# Patient Record
Sex: Male | Born: 1965 | Race: White | Hispanic: No | Marital: Married | Smoking: Former smoker
Health system: Southern US, Community
[De-identification: ages and names within clinical notes are randomized; demographics above are authoritative.]

---

## 2014-09-19 ENCOUNTER — Ambulatory Visit (INDEPENDENT_AMBULATORY_CARE_PROVIDER_SITE_OTHER): Payer: Self-pay | Admitting: Sports Medicine

## 2014-09-19 ENCOUNTER — Encounter: Payer: Self-pay | Admitting: Sports Medicine

## 2014-09-19 ENCOUNTER — Ambulatory Visit (INDEPENDENT_AMBULATORY_CARE_PROVIDER_SITE_OTHER): Payer: 59

## 2014-09-19 VITALS — BP 134/88 | HR 93 | Ht 75.0 in | Wt 271.0 lb

## 2014-09-19 DIAGNOSIS — M5440 Lumbago with sciatica, unspecified side: Secondary | ICD-10-CM

## 2014-09-19 DIAGNOSIS — R5382 Chronic fatigue, unspecified: Secondary | ICD-10-CM

## 2014-09-19 DIAGNOSIS — M25562 Pain in left knee: Secondary | ICD-10-CM

## 2014-09-19 DIAGNOSIS — R5383 Other fatigue: Secondary | ICD-10-CM

## 2014-09-19 DIAGNOSIS — M25462 Effusion, left knee: Secondary | ICD-10-CM | POA: Diagnosis not present

## 2014-09-19 DIAGNOSIS — M25552 Pain in left hip: Secondary | ICD-10-CM | POA: Diagnosis not present

## 2014-09-19 DIAGNOSIS — X58XXXA Exposure to other specified factors, initial encounter: Secondary | ICD-10-CM | POA: Diagnosis not present

## 2014-09-19 DIAGNOSIS — M25512 Pain in left shoulder: Secondary | ICD-10-CM | POA: Insufficient documentation

## 2014-09-19 DIAGNOSIS — B351 Tinea unguium: Secondary | ICD-10-CM

## 2014-09-19 DIAGNOSIS — S32058A Other fracture of fifth lumbar vertebra, initial encounter for closed fracture: Secondary | ICD-10-CM | POA: Diagnosis not present

## 2014-09-19 DIAGNOSIS — M25561 Pain in right knee: Secondary | ICD-10-CM | POA: Diagnosis not present

## 2014-09-19 DIAGNOSIS — Z0000 Encounter for general adult medical examination without abnormal findings: Secondary | ICD-10-CM | POA: Insufficient documentation

## 2014-09-19 DIAGNOSIS — M25551 Pain in right hip: Secondary | ICD-10-CM

## 2014-09-19 DIAGNOSIS — E785 Hyperlipidemia, unspecified: Secondary | ICD-10-CM

## 2014-09-19 DIAGNOSIS — M545 Low back pain, unspecified: Secondary | ICD-10-CM | POA: Insufficient documentation

## 2014-09-19 MED ORDER — MELOXICAM 15 MG PO TABS: ORAL_TABLET | ORAL | Status: DC

## 2014-09-19 MED ORDER — TERBINAFINE HCL 250 MG PO TABS: 250.0000 mg | ORAL_TABLET | Freq: Every day | ORAL | Status: AC

## 2014-09-19 NOTE — Assessment & Plan Note (Signed)
Getting routine blood work. Next line we will work on routine screening measures at a future visit.

## 2014-09-19 NOTE — Assessment & Plan Note (Signed)
Left supraspinatus dysfunction, formal physical therapy, x-rays.

## 2014-09-19 NOTE — Assessment & Plan Note (Signed)
Most likely osteoarthritis. Neck slight meloxicam, x-rays. Injection of no better.

## 2014-09-19 NOTE — Progress Notes (Signed)
  Subjective:    CC: Establish care.   HPI:  Mellody DanceKeith comes in with multiple issues.  Left knee pain: Painful at the joint line and under the kneecap, moderate, persistent without radiation.  Left shoulder pain: Worse over the deltoid, worse with overhead activities, moderate, persistent without radiation.  Onychomycosis: Great toe, has tried over-the-counter remedies, without any improvement.  Low back pain: With radiation down the left leg, to the medial aspect of the left lower leg and medial aspect of the left foot. Worse with sitting, flexion, Valsalva, driving a car. No bowel or bladder dysfunction or saddle numbness.   Preventative measures: Is amenable to discuss weight loss measures, has never seen a doctor in 7 years, needs some blood work.  Past medical history, Surgical history, Family history not pertinant except as noted below, Social history, Allergies, and medications have been entered into the medical record, reviewed, and no changes needed.   Review of Systems: No headache, visual changes, nausea, vomiting, diarrhea, constipation, dizziness, abdominal pain, skin rash, fevers, chills, night sweats, swollen lymph nodes, weight loss, chest pain, body aches, joint swelling, muscle aches, shortness of breath, mood changes, visual or auditory hallucinations.  Objective:    General: Well Developed, well nourished, and in no acute distress.  Neuro: Alert and oriented x3, extra-ocular muscles intact, sensation grossly intact. Cranial nerves II through XII are intact, motor, sensory, and coordinative functions are all intact. HEENT: Normocephalic, atraumatic, pupils equal round reactive to light, neck supple, no masses, no lymphadenopathy, thyroid nonpalpable. Oropharynx, nasopharynx, external ear canals are unremarkable. Skin: Warm and dry, no rashes noted.  Cardiac: Regular rate and rhythm, no murmurs rubs or gallops.  Respiratory: Clear to auscultation bilaterally. Not using  accessory muscles, speaking in full sentences.  Abdominal: Soft, nontender, nondistended, positive bowel sounds, no masses, no organomegaly.  Left Shoulder: Inspection reveals no abnormalities, atrophy or asymmetry. Palpation is normal with no tenderness over AC joint or bicipital groove. ROM is full in all planes. Supraspinatus is weak. Positive signs of impingement with a positive empty can sign, Hawkin sign, and Neer sign.  Speeds and Yergason's tests normal. No labral pathology noted with negative Obrien's, negative crank, negative clunk, and good stability. Normal scapular function observed. No painful arc and no drop arm sign. No apprehension sign Left Knee: Minimally swollen with a fluid wave and an effusion as well as tenderness at the medial joint line ROM normal in flexion and extension and lower leg rotation. Ligaments with solid consistent endpoints including ACL, PCL, LCL, MCL. Negative Mcmurray's and provocative meniscal tests. Non painful patellar compression. Patellar and quadriceps tendons unremarkable. Hamstring and quadriceps strength is normal.  Impression and Recommendations:    The patient was counselled, risk factors were discussed, anticipatory guidance given.

## 2014-09-19 NOTE — Assessment & Plan Note (Signed)
Per patient, questionable dislocation. Left worse than right hip pain referable to the joint. X-rays. We will treat in the future.

## 2014-09-19 NOTE — Assessment & Plan Note (Signed)
Checking blood work

## 2014-09-19 NOTE — Assessment & Plan Note (Signed)
Most likely left L4 radiculopathy. Mobic, physical therapy.

## 2014-09-19 NOTE — Assessment & Plan Note (Signed)
Based on appearance I do suspect that we will need approximately 6-9 months of oral treatment with Lamisil. We will start Lamisil 250 mg daily.

## 2014-09-20 NOTE — Addendum Note (Signed)
Addended by: Monica BectonHEKKEKANDAM, Szymon Foiles J on: 09/20/2014 01:24 AM   Modules accepted: Level of Service

## 2014-09-25 DIAGNOSIS — E785 Hyperlipidemia, unspecified: Secondary | ICD-10-CM | POA: Insufficient documentation

## 2014-09-25 LAB — CBC
HCT: 43.3 % (ref 39.0–52.0)
Hemoglobin: 15.5 g/dL (ref 13.0–17.0)
MCH: 30.1 pg (ref 26.0–34.0)
MCHC: 35.8 g/dL (ref 30.0–36.0)
MCV: 84.1 fL (ref 78.0–100.0)
MPV: 10.1 fL (ref 8.6–12.4)
Platelets: 194 10*3/uL (ref 150–400)
RBC: 5.15 MIL/uL (ref 4.22–5.81)
RDW: 14 % (ref 11.5–15.5)
WBC: 7.7 K/uL (ref 4.0–10.5)

## 2014-09-25 LAB — COMPREHENSIVE METABOLIC PANEL
ALT: 37 U/L (ref 0–53)
Albumin: 4.4 g/dL (ref 3.5–5.2)
CO2: 22 mEq/L (ref 19–32)
Calcium: 9.4 mg/dL (ref 8.4–10.5)
Chloride: 102 mEq/L (ref 96–112)
Glucose, Bld: 84 mg/dL (ref 70–99)
Potassium: 4 mEq/L (ref 3.5–5.3)
Sodium: 137 mEq/L (ref 135–145)
Total Bilirubin: 1 mg/dL (ref 0.2–1.2)
Total Protein: 7.3 g/dL (ref 6.0–8.3)

## 2014-09-25 LAB — VITAMIN D 25 HYDROXY (VIT D DEFICIENCY, FRACTURES): Vit D, 25-Hydroxy: 12 ng/mL — ABNORMAL LOW (ref 30–100)

## 2014-09-25 LAB — TESTOSTERONE, FREE, TOTAL, SHBG
Sex Hormone Binding: 21 nmol/L (ref 10–50)
Testosterone, Free: 87.4 pg/mL (ref 47.0–244.0)
Testosterone-% Free: 2.5 % (ref 1.6–2.9)
Testosterone: 346 ng/dL (ref 300–890)

## 2014-09-25 LAB — LIPID PANEL
Cholesterol: 253 mg/dL — ABNORMAL HIGH (ref 0–200)
HDL: 34 mg/dL — ABNORMAL LOW (ref >=40)
Total CHOL/HDL Ratio: 7.4 ratio
Triglycerides: 435 mg/dL — ABNORMAL HIGH (ref <150)

## 2014-09-25 LAB — COMPREHENSIVE METABOLIC PANEL WITH GFR
AST: 25 U/L (ref 0–37)
Alkaline Phosphatase: 77 U/L (ref 39–117)
BUN: 12 mg/dL (ref 6–23)
Creat: 1.03 mg/dL (ref 0.50–1.35)

## 2014-09-25 LAB — HEMOGLOBIN A1C
Hgb A1c MFr Bld: 5.6 % (ref <5.7)
Mean Plasma Glucose: 114 mg/dL (ref <117)

## 2014-09-25 LAB — TSH: TSH: 1.14 u[IU]/mL (ref 0.350–4.500)

## 2014-09-25 MED ORDER — ATORVASTATIN CALCIUM 40 MG PO TABS: 40.0000 mg | ORAL_TABLET | Freq: Every day | ORAL | Status: DC

## 2014-09-25 MED ORDER — VITAMIN D (ERGOCALCIFEROL) 1.25 MG (50000 UNIT) PO CAPS: 50000.0000 [IU] | ORAL_CAPSULE | ORAL | Status: DC

## 2014-09-25 NOTE — Assessment & Plan Note (Signed)
Starting high-dose Lipitor, recheck in 3 months.

## 2014-09-25 NOTE — Addendum Note (Signed)
Addended by: Monica BectonHEKKEKANDAM, THOMAS J on: 09/25/2014 01:18 PM   Modules accepted: Orders

## 2014-10-01 ENCOUNTER — Encounter: Payer: Self-pay | Admitting: Physical Therapy

## 2014-10-01 ENCOUNTER — Ambulatory Visit (INDEPENDENT_AMBULATORY_CARE_PROVIDER_SITE_OTHER): Payer: 59 | Admitting: Physical Therapy

## 2014-10-01 DIAGNOSIS — R531 Weakness: Secondary | ICD-10-CM

## 2014-10-01 DIAGNOSIS — R293 Abnormal posture: Secondary | ICD-10-CM

## 2014-10-01 DIAGNOSIS — R52 Pain, unspecified: Secondary | ICD-10-CM | POA: Diagnosis not present

## 2014-10-01 NOTE — Patient Instructions (Signed)
Pelvic Press   [place] [id number]   Place hands under belly between navel and pubic bone, palms up. Feel pressure on hands. Increase pressure on hands by pressing pelvis down. This is NOT a pelvic tilt. Hold ___ seconds. Relax. Repeat ___ times.  Copyright  VHI. All rights reserved.  Leg Lift: One-Leg   Press pelvis down. Keep knee straight; lengthen and lift one leg (from waist). Do not twist body. Keep other leg down. Hold ___ seconds. Relax. Repeat 1 time. Repeat with other leg.  Copyright  VHI. All rights reserved.  Scapular Retraction (Standing)   With arms at sides, pinch shoulder blades together. Repeat __10__ times per set. Do _1___ sets per session. Do __1-2__ sessions per day.  http://orth.exer.us/944  Resisted External Rotation: in Neutral - Bilateral   Sit or stand, tubing in both hands, elbows at sides, bent to 90, forearms forward. Pinch shoulder blades together and rotate forearms out. Keep elbows at sides. Repeat _10___ times per set. Do __2-3__ sets per session. Do __1__ sessions per day. http://orth.exer.us/966   Copyright  VHI. All rights reserved.

## 2014-10-01 NOTE — Therapy (Signed)
[facility] [place] [address] [place], [place], [address] Phone: [phone]   Fax:  [phone]  Physical Therapy Evaluation  Patient Details  Name: Wesley Perez MRN: [id number] Date of Birth: 1966/04/03 Referring Provider:  Monica Becton,*  Encounter Date: 10/01/2014      PT End of Session - 10/01/14 1634    Visit Number 1   Number of Visits 4   Date for PT Re-Evaluation 10/22/14   PT Start Time 1430   PT Stop Time 1531   PT Time Calculation (min) 61 min      History reviewed. No pertinent past medical history.  History reviewed. No pertinent past surgical history.  There were no vitals filed for this visit.  Visit Diagnosis:  Pain of multiple sites - Plan: PT plan of care cert/re-cert  Weakness generalized - Plan: PT plan of care cert/re-cert  Abnormal posture - Plan: PT plan of care cert/re-cert      Subjective Assessment - 10/01/14 1441    Subjective Pt reports pain started at 49 yo while playing baseball and dislocated his back and Lt hip, they were put back and he recovered.  As he ages he has noticed the pain has gotten worse. Lt shoulder lifting weights and had an injury 10-15 yrs ago, with recent flare up   Pertinent History 3 yrs ago pain really began to increase quicker than before, reports he has also put on weight over the last 3 yrs.     How long can you stand comfortably? 5-10 minutes   How long can you walk comfortably? limited, gets figity.    Diagnostic tests MRI 10 yrs ago dx with hernieated discs, conservative management. x-rays of shoulder, knee and back are negative, show old L5 compression fx.    Patient Stated Goals decrease pain   Currently in Pain? No/denies   Aggravating Factors  bending over, lifting, holding phone up to ear.    Effect of Pain on Daily Activities taking breaks and rest in a chair, extend his back. medication has significantly decreased his knee pain and some in  the back, not the shoulder.             [facility] PT Assessment - 10/01/14 0001    Assessment   Medical Diagnosis midline low back pain, Lt shoulder and knee pain   Onset Date/Surgical Date 10/01/11   Hand Dominance Right   Next MD Visit w+3   Precautions   Precautions None   Balance Screen   Has the patient fallen in the past 6 months No   Has the patient had a decrease in activity level because of a fear of falling?  No   Is the patient reluctant to leave their home because of a fear of falling?  No   Prior Function   Level of Independence --  independent   Vocation Full time employment   Vocation Requirements self imployed, furniture, works on [employer].    Observation/Other Assessments   Focus on Therapeutic Outcomes (FOTO)  51% limited   Posture/Postural Control   Posture/Postural Control Postural limitations   Postural Limitations Forward head;Rounded Shoulders;Left pelvic obliquity  Lt shoulder complex elevated in standing   Posture Comments --  supine Lt LE shorter than Rt and back is level/straight   ROM / Strength   AROM / PROM / Strength AROM;Strength  UE's, lumbar and cervical WNL   Strength   Overall Strength --  UE's WNL   Palpation  Palpation comment pain in anterior Lt shoulder at greater tubercle                   [facility] Treatment/Exercise - 10/01/14 0001    Exercises   Exercises Shoulder;Lumbar   Lumbar Exercises: Stretches   Single Knee to Chest Stretch 30 seconds  each side   Lumbar Exercises: Prone   Straight Leg Raise 10 reps  with pelvic press   Other Prone Lumbar Exercises 10 reps pelvic press   Shoulder Exercises: Seated   External Rotation Both;20 reps;Theraband  began with blue band, decreased to green due to pain.                 PT Education - 10/01/14 1524    Education provided Yes   Education Details HEP - pt instructed to stop shoulder exercise if he has pain   Person(s) Educated Patient   Methods  Explanation;Demonstration;Handout   Comprehension Returned demonstration             PT Long Term Goals - 10/01/14 1643    PT LONG TERM GOAL #1   Title I with advanced HEP (10/22/14)   Time 3   Period Weeks   Status New   PT LONG TERM GOAL #2   Title reports pain decrease in Lt shoulder =/> 50% with sustained positions (10/22/14)   Time 3   Period Weeks   Status New   PT LONG TERM GOAL #3   Title report pain decrease in back =/> 50% with tasks requiring a forward lean (10/22/14)   Time 3   Period Weeks   Status New   PT LONG TERM GOAL #4   Title improve FOTO =/< 35% limited   Time 3   Period Weeks   Status New               Plan - 10/01/14 1634    Clinical Impression Statement 49 yo male presents with multiple areas of pain stemming from injuries multiple years ago. He reports he has noticed that as he gets older the pain is worse with certain activities. He has not been under medical care lately and has sought  out medical care now.  He wishes to have decreased pain with his daily activities   Pt will benefit from skilled therapeutic intervention in order to improve on the following deficits Pain;Decreased strength;Improper body mechanics   Rehab Potential Good   PT Frequency 2x / week   PT Duration 3 weeks   PT Treatment/Interventions Moist Heat;Therapeutic exercise;Ultrasound;Manual techniques;Vasopneumatic Device;Neuromuscular re-education;Cryotherapy;Electrical Stimulation;Patient/family education   PT Next Visit Plan assess tolerance to HEP, progress core and see if he would benefit from a heel lift on the Lt   Consulted and Agree with Plan of Care Patient         Problem List Patient Active Problem List   Diagnosis Date Noted  . Hyperlipidemia 09/25/2014  . Annual physical exam 09/19/2014  . Fatigue 09/19/2014  . Left shoulder pain 09/19/2014  . Left knee pain 09/19/2014  . Low back pain 09/19/2014  . Bilateral hip pain 09/19/2014  . Onychomycosis  09/19/2014    Roderic ScarceSusan Shaver, PT 10/01/2014, 4:50 PM  [facility] [address] [place], [place], [address] Phone: [phone]   Fax:  [phone]

## 2014-10-08 ENCOUNTER — Ambulatory Visit (INDEPENDENT_AMBULATORY_CARE_PROVIDER_SITE_OTHER): Payer: 59 | Admitting: Physical Therapy

## 2014-10-08 DIAGNOSIS — R293 Abnormal posture: Secondary | ICD-10-CM

## 2014-10-08 DIAGNOSIS — R531 Weakness: Secondary | ICD-10-CM | POA: Diagnosis not present

## 2014-10-08 DIAGNOSIS — R52 Pain, unspecified: Secondary | ICD-10-CM

## 2014-10-08 NOTE — Patient Instructions (Signed)
  Abdominal Bracing With Pelvic Floor (Hook-Lying)   With neutral spine, tighten pelvic floor and abdominals. Hold 10 seconds. Repeat __10_ times. Do _1__ times a day.  Knee to Chest: Transverse Plane Stability   Bring one knee up, then return. Be sure pelvis does not roll side to side. Keep pelvis still. Lift knee __10_ times each leg. Restabilize pelvis. Repeat with other leg. Do _2__ sets, _1__ times per day.  http://ss.exer.us/7   Hip External Rotation With Pillow: Transverse Plane Stability   One knee bent, one leg straight, on pillow. Slowly roll bent knee out. Be sure pelvis does not rotate. Do _10__ times. Restabilize pelvis. Repeat with other leg. Do _1-2__ sets, _1__ times per day.   PNF Strengthening: Resisted   While lying on back: (RED BAND) Standing with resistive band around each hand, bring right arm up and away, thumb back. Repeat __10__ times per set. Do __2__ sets per session. Do _1___ sessions per day.  http://orth.exer.us/918   [facility] [address] [place], [place] [address]  [phone] (office) [phone] (fax)

## 2014-10-08 NOTE — Therapy (Addendum)
[place], [place], [address] Phone: [phone]   Fax:  [phone]  Physical Therapy Treatment  Patient Details  Name: Wesley Perez MRN: [id number] Date of Birth: June 10, 1965 Referring Provider:  Silverio Decamp,*  Encounter Date: 10/08/2014      PT End of Session - 10/08/14 1436    Visit Number 2   Number of Visits 4   Date for PT Re-Evaluation 10/22/14   PT Start Time 3818   PT Stop Time 1518   PT Time Calculation (min) 50 min   Activity Tolerance Patient tolerated treatment well      No past medical history on file.  No past surgical history on file.  There were no vitals filed for this visit.  Visit Diagnosis:  Pain of multiple sites  Weakness generalized  Abnormal posture      Subjective Assessment - 10/08/14 1431    Subjective Pt has been performing arm exercises, abstained from LB exercises due to fear of increasing back pain.  Pt reports he had pain in back after eval, resolved with rest.    Currently in Pain? No/denies  up to 5/10 with transitions (in Low back).  Shoulder and knee pain "come and go" - not currently experiencing pain.    Aggravating Factors  transition movements    Pain Relieving Factors rest, stretching             [facility] PT Assessment - 10/08/14 0001    Assessment   Medical Diagnosis midline low back pain, Lt shoulder and knee pain   Onset Date/Surgical Date 10/01/11   Hand Dominance Right            [facility] Adult PT Treatment/Exercise - 10/08/14 0001    Lumbar Exercises: Stretches   Double Knee to Chest Stretch 2 reps;20 seconds   Lower Trunk Rotation 5 reps;10 seconds   Lumbar Exercises: Aerobic   Stationary Bike NuStep L4: 5 min    Lumbar Exercises: Supine   Ab Set 10 reps;5 seconds   Other Supine Lumbar Exercises Trans abd with hip out/in x 10 reps each leg. With marching x 20 steps    Lumbar Exercises: Prone   Other Prone Lumbar  Exercises Prone pelvic press x 5 sec hold x 10 reps    Shoulder Exercises: Supine   Horizontal ABduction Both;Strengthening;Theraband;10 reps  only able to do 8 reps, stopped pain-moved to red.    Theraband Level (Shoulder Horizontal ABduction) Level 3 (Green);Level 2 (Red)   External Rotation Strengthening;Both;10 reps;Theraband   Theraband Level (Shoulder External Rotation) Level 3 (Green)   Other Supine Exercises D2 (shoulder diagonals) with red band x 10 each side    Modalities   Modalities Cryotherapy   Cryotherapy   Number Minutes Cryotherapy 12 Minutes   Cryotherapy Location Shoulder;Lumbar Spine   Type of Cryotherapy Ice pack                PT Education - 10/08/14 1440    Education provided Yes   Education Details Body mechanics- pt instructed in log roll and engaging core for transitions to ease pain. Self care: instructed on self massage to Lt ant shoulder. HEP.    Person(s) Educated Patient   Methods Explanation;Demonstration;Handout   Comprehension Returned demonstration;Verbalized understanding             PT Long Term Goals - 10/01/14 1643    PT LONG TERM GOAL #1   Title I with advanced HEP (10/22/14)  Time 3   Period Weeks   Status New   PT LONG TERM GOAL #2   Title reports pain decrease in Lt shoulder =/> 50% with sustained positions (10/22/14)   Time 3   Period Weeks   Status New   PT LONG TERM GOAL #3   Title report pain decrease in back =/> 50% with tasks requiring a forward lean (10/22/14)   Time 3   Period Weeks   Status New   PT LONG TERM GOAL #4   Title improve FOTO =/< 35% limited   Time 3   Period Weeks   Status New               Plan - 10/08/14 1610    Clinical Impression Statement Pt reported some "deep" anterior Lt shoulder pain with theraband exercises, reduced with decreased resistance or less reps. Pt required some tactile/VC for form.  No goals met; only 2nd visit.   Pt will benefit from skilled therapeutic  intervention in order to improve on the following deficits Pain;Decreased strength;Improper body mechanics   Rehab Potential Good   PT Frequency 2x / week   PT Duration 3 weeks   PT Treatment/Interventions Moist Heat;Therapeutic exercise;Ultrasound;Manual techniques;Vasopneumatic Device;Neuromuscular re-education;Cryotherapy;Electrical Stimulation;Patient/family education   PT Next Visit Plan assess tolerance to HEP, progress core, see if he would benefit from a heel lift on the Lt   Consulted and Agree with Plan of Care Patient        Problem List Patient Active Problem List   Diagnosis Date Noted  . Hyperlipidemia 09/25/2014  . Annual physical exam 09/19/2014  . Fatigue 09/19/2014  . Left shoulder pain 09/19/2014  . Left knee pain 09/19/2014  . Low back pain 09/19/2014  . Bilateral hip pain 09/19/2014  . Onychomycosis 09/19/2014   Kerin Perna, PTA 10/08/2014 4:15 PM  [facility] [place], [place], [address] Phone: [phone]   Fax:  [phone]     PHYSICAL THERAPY DISCHARGE SUMMARY  Visits from Start of Care: 2  Current functional level related to goals / functional outcomes: unknown   Remaining deficits: unknown   Education / Equipment: Initial HEP Plan:                                                    Patient goals were not met. Patient is being discharged due to not returning since the last visit.  ?????    Jeral Pinch, PT 11/19/2014 8:05 AM

## 2014-10-15 ENCOUNTER — Encounter: Payer: 59 | Admitting: Physical Therapy

## 2014-10-17 ENCOUNTER — Ambulatory Visit: Payer: 59 | Admitting: Sports Medicine

## 2014-10-21 ENCOUNTER — Encounter: Payer: Self-pay | Admitting: Sports Medicine

## 2014-10-21 ENCOUNTER — Ambulatory Visit (INDEPENDENT_AMBULATORY_CARE_PROVIDER_SITE_OTHER): Payer: 59 | Admitting: Sports Medicine

## 2014-10-21 VITALS — BP 124/79 | HR 83 | Ht 75.0 in | Wt 270.0 lb

## 2014-10-21 DIAGNOSIS — M5440 Lumbago with sciatica, unspecified side: Secondary | ICD-10-CM

## 2014-10-21 DIAGNOSIS — E785 Hyperlipidemia, unspecified: Secondary | ICD-10-CM

## 2014-10-21 DIAGNOSIS — E669 Obesity, unspecified: Secondary | ICD-10-CM | POA: Diagnosis not present

## 2014-10-21 DIAGNOSIS — M25512 Pain in left shoulder: Secondary | ICD-10-CM | POA: Diagnosis not present

## 2014-10-21 DIAGNOSIS — M25562 Pain in left knee: Secondary | ICD-10-CM

## 2014-10-21 MED ORDER — PHENTERMINE HCL 37.5 MG PO TABS: ORAL_TABLET | ORAL | Status: DC

## 2014-10-21 NOTE — Assessment & Plan Note (Signed)
Most likely left L4 radiculopathy. Hasn't really done much physical therapy. He is going to work more aggressively with PT on his lumbar spine, and we can do an MRI for intervention if no better at follow-up visit.

## 2014-10-21 NOTE — Assessment & Plan Note (Signed)
Continue Lipitor, recheck in 3 months.

## 2014-10-21 NOTE — Progress Notes (Signed)
  Subjective:    CC: Follow-up  HPI: Knee osteoarthritis: Resolved with physical therapy and meloxicam  Obesity: Desires to start weight loss medication, unable to lose any weight with dieting and exercise  Low back pain: Clinical left L4 radiculopathy, has really not that any physical therapy on this.  Left shoulder pain: Localized of the deltoid, worse with overhead activities, moderate, persistent without radiation, physical therapy has not been entirely effective.  Past medical history, Surgical history, Family history not pertinant except as noted below, Social history, Allergies, and medications have been entered into the medical record, reviewed, and no changes needed.   Review of Systems: No fevers, chills, night sweats, weight loss, chest pain, or shortness of breath.   Objective:    General: Well Developed, well nourished, and in no acute distress.  Neuro: Alert and oriented x3, extra-ocular muscles intact, sensation grossly intact.  HEENT: Normocephalic, atraumatic, pupils equal round reactive to light, neck supple, no masses, no lymphadenopathy, thyroid nonpalpable.  Skin: Warm and dry, no rashes. Cardiac: Regular rate and rhythm, no murmurs rubs or gallops, no lower extremity edema.  Respiratory: Clear to auscultation bilaterally. Not using accessory muscles, speaking in full sentences. Left Shoulder: Inspection reveals no abnormalities, atrophy or asymmetry. Palpation is normal with no tenderness over AC joint or bicipital groove. ROM is full in all planes. Rotator cuff strength normal throughout. Positive Neer and Hawkin's tests, empty can. Speeds and Yergason's tests normal. No labral pathology noted with negative Obrien's, negative crank, negative clunk, and good stability. Normal scapular function observed. No painful arc and no drop arm sign. No apprehension sign.  Procedure: Real-time Ultrasound Guided Injection of left subacromial bursa Device: GE Logiq E    Verbal informed consent obtained.  Time-out conducted.  Noted no overlying erythema, induration, or other signs of local infection.  Skin prepped in a sterile fashion.  Local anesthesia: Topical Ethyl chloride.  With sterile technique and under real time ultrasound guidance:  Noted mildly distended subacromial bursa, 1 mL kenalog 40, 3 mL lidocaine injected easily. Completed without difficulty  Pain immediately resolved suggesting accurate placement of the medication.  Advised to call if fevers/chills, erythema, induration, drainage, or persistent bleeding.  Images permanently stored and available for review in the ultrasound unit.  Impression: Technically successful ultrasound guided injection.  Impression and Recommendations:    I spent 40 minutes with this patient, greater than 50% was face-to-face time counseling regarding the above diagnoses.

## 2014-10-21 NOTE — Assessment & Plan Note (Signed)
Starting phentermine, return monthly for weight checks and refills. 

## 2014-10-21 NOTE — Assessment & Plan Note (Signed)
Resolved with physical therapy and oral medication.

## 2014-10-21 NOTE — Assessment & Plan Note (Signed)
Pain is referable to the subscapularis and supraspinatus. Has failed physical therapy as well as oral NSAIDs. Subacromial injection as above. Return in one month.

## 2014-10-27 ENCOUNTER — Ambulatory Visit: Payer: 59 | Admitting: Sports Medicine

## 2014-11-12 ENCOUNTER — Other Ambulatory Visit: Payer: Self-pay | Admitting: Sports Medicine

## 2014-11-18 ENCOUNTER — Ambulatory Visit: Payer: 59 | Admitting: Sports Medicine

## 2014-11-19 ENCOUNTER — Other Ambulatory Visit: Payer: Self-pay | Admitting: Sports Medicine

## 2014-11-20 ENCOUNTER — Telehealth: Payer: Self-pay

## 2014-11-20 DIAGNOSIS — E669 Obesity, unspecified: Secondary | ICD-10-CM

## 2014-11-20 MED ORDER — PHENTERMINE HCL 37.5 MG PO TABS: ORAL_TABLET | ORAL | Status: DC

## 2014-11-20 NOTE — Telephone Encounter (Signed)
#  6 phentermine in box.

## 2014-11-20 NOTE — Telephone Encounter (Signed)
Patient has an appointment on the 20 th for blood pressure and weight check. He will be out of phentermine before then and would like a refill before his appointment.

## 2014-11-21 ENCOUNTER — Ambulatory Visit (INDEPENDENT_AMBULATORY_CARE_PROVIDER_SITE_OTHER): Payer: 59 | Admitting: Sports Medicine

## 2014-11-21 ENCOUNTER — Encounter: Payer: Self-pay | Admitting: Sports Medicine

## 2014-11-21 VITALS — BP 121/81 | HR 96 | Ht 75.0 in | Wt 238.0 lb

## 2014-11-21 DIAGNOSIS — E669 Obesity, unspecified: Secondary | ICD-10-CM | POA: Diagnosis not present

## 2014-11-21 DIAGNOSIS — M25512 Pain in left shoulder: Secondary | ICD-10-CM

## 2014-11-21 DIAGNOSIS — M5440 Lumbago with sciatica, unspecified side: Secondary | ICD-10-CM | POA: Diagnosis not present

## 2014-11-21 MED ORDER — PHENTERMINE HCL 37.5 MG PO TABS: ORAL_TABLET | ORAL | Status: DC

## 2014-11-21 NOTE — Assessment & Plan Note (Signed)
Left L4 radiculopathy, has plateaued with physical therapy and still has symptoms, MRI for intervention.

## 2014-11-21 NOTE — Progress Notes (Signed)
  Subjective:    CC: Follow-up  HPI: Obesity: Nearly 40 pound weight loss in the first month  Low back pain: With left-sided L4 radiculopathy, persistent symptoms despite physical therapy.  Left shoulder pain: Resolved with injection and rehabilitation  Past medical history, Surgical history, Family history not pertinant except as noted below, Social history, Allergies, and medications have been entered into the medical record, reviewed, and no changes needed.   Review of Systems: No fevers, chills, night sweats, weight loss, chest pain, or shortness of breath.   Objective:    General: Well Developed, well nourished, and in no acute distress.  Neuro: Alert and oriented x3, extra-ocular muscles intact, sensation grossly intact.  HEENT: Normocephalic, atraumatic, pupils equal round reactive to light, neck supple, no masses, no lymphadenopathy, thyroid nonpalpable.  Skin: Warm and dry, no rashes. Cardiac: Regular rate and rhythm, no murmurs rubs or gallops, no lower extremity edema.  Respiratory: Clear to auscultation bilaterally. Not using accessory muscles, speaking in full sentences.  Impression and Recommendations:

## 2014-11-21 NOTE — Assessment & Plan Note (Signed)
Resolved after injection and rehabilitation

## 2014-11-21 NOTE — Assessment & Plan Note (Signed)
32 pound weight loss in one month. Refilling phentermine, return in one month.

## 2014-12-19 ENCOUNTER — Ambulatory Visit (INDEPENDENT_AMBULATORY_CARE_PROVIDER_SITE_OTHER): Payer: 59 | Admitting: Sports Medicine

## 2014-12-19 ENCOUNTER — Encounter: Payer: Self-pay | Admitting: Sports Medicine

## 2014-12-19 VITALS — BP 123/81 | HR 92 | Ht 75.0 in | Wt 227.0 lb

## 2014-12-19 DIAGNOSIS — M25562 Pain in left knee: Secondary | ICD-10-CM | POA: Diagnosis not present

## 2014-12-19 DIAGNOSIS — E669 Obesity, unspecified: Secondary | ICD-10-CM

## 2014-12-19 DIAGNOSIS — E785 Hyperlipidemia, unspecified: Secondary | ICD-10-CM

## 2014-12-19 DIAGNOSIS — M5440 Lumbago with sciatica, unspecified side: Secondary | ICD-10-CM

## 2014-12-19 MED ORDER — TERBINAFINE HCL 250 MG PO TABS: 250.0000 mg | ORAL_TABLET | Freq: Every day | ORAL | Status: DC

## 2014-12-19 MED ORDER — ATORVASTATIN CALCIUM 40 MG PO TABS
40.0000 mg | ORAL_TABLET | Freq: Every day | ORAL | Status: AC
Start: 2014-12-19 — End: ?

## 2014-12-19 MED ORDER — DICLOFENAC SODIUM 2 % TD SOLN: 2.0000 | Freq: Two times a day (BID) | TRANSDERMAL | Status: AC

## 2014-12-19 MED ORDER — PHENTERMINE HCL 37.5 MG PO TABS: ORAL_TABLET | ORAL | Status: DC

## 2014-12-19 MED ORDER — MELOXICAM 15 MG PO TABS: ORAL_TABLET | ORAL | Status: AC

## 2014-12-19 NOTE — Progress Notes (Signed)
  Subjective:    CC: Follow-up  HPI: Obesity: An additional 11 pound weight loss after the second month of phentermine, doing extremely well.  Knee pain: Has improved significantly with weight loss, starting to have bit of pain in the right knee but minimal.  Low back pain: Improved significantly with weight loss and physical therapy, still awaiting MRI.  Past medical history, Surgical history, Family history not pertinant except as noted below, Social history, Allergies, and medications have been entered into the medical record, reviewed, and no changes needed.   Review of Systems: No fevers, chills, night sweats, weight loss, chest pain, or shortness of breath.   Objective:    General: Well Developed, well nourished, and in no acute distress.  Neuro: Alert and oriented x3, extra-ocular muscles intact, sensation grossly intact.  HEENT: Normocephalic, atraumatic, pupils equal round reactive to light, neck supple, no masses, no lymphadenopathy, thyroid nonpalpable.  Skin: Warm and dry, no rashes. Cardiac: Regular rate and rhythm, no murmurs rubs or gallops, no lower extremity edema.  Respiratory: Clear to auscultation bilaterally. Not using accessory muscles, speaking in full sentences. Right Knee: Normal to inspection with no erythema or effusion or obvious bony abnormalities. Palpation normal with no warmth or joint line tenderness or patellar tenderness or condyle tenderness. ROM normal in flexion and extension and lower leg rotation. Ligaments with solid consistent endpoints including ACL, PCL, LCL, MCL. Negative Mcmurray's and provocative meniscal tests. Non painful patellar compression. Patellar and quadriceps tendons unremarkable. Hamstring and quadriceps strength is normal.  Impression and Recommendations:    I spent 25 minutes with this patient, greater than 50% was face-to-face time counseling regarding the above diagnoses

## 2014-12-19 NOTE — Assessment & Plan Note (Signed)
Additional 11 pound weight loss after 2 months of phentermine, this brings totally lost over 40 pounds. Refilling for an additional month, return to see me in one month for a weight check.

## 2014-12-19 NOTE — Assessment & Plan Note (Signed)
Starting to have some pain in the right knee as well. Topical pennsaid samples given.

## 2014-12-23 ENCOUNTER — Telehealth: Payer: Self-pay

## 2014-12-23 NOTE — Telephone Encounter (Signed)
Started a PA for terbinafine 250 mg. Awaiting a response.

## 2014-12-30 MED ORDER — TERBINAFINE HCL 250 MG PO TABS: 250.0000 mg | ORAL_TABLET | Freq: Every day | ORAL | Status: AC

## 2014-12-30 NOTE — Telephone Encounter (Signed)
Patient should go to [facility] and pay in cash, rx sent to [facility].

## 2014-12-30 NOTE — Telephone Encounter (Signed)
Patient advised.

## 2014-12-30 NOTE — Telephone Encounter (Signed)
PA for terbinafine was denied. The insurance company will only pay for 90 days in a calender year. Patient states he still needs some type of medication to resolve the toe nail fungus. Please advise.

## 2015-01-16 ENCOUNTER — Ambulatory Visit (INDEPENDENT_AMBULATORY_CARE_PROVIDER_SITE_OTHER): Payer: 59 | Admitting: Sports Medicine

## 2015-01-16 ENCOUNTER — Encounter: Payer: Self-pay | Admitting: Sports Medicine

## 2015-01-16 VITALS — BP 116/78 | HR 92 | Ht 75.0 in | Wt 222.0 lb

## 2015-01-16 DIAGNOSIS — E669 Obesity, unspecified: Secondary | ICD-10-CM

## 2015-01-16 MED ORDER — PHENTERMINE HCL 37.5 MG PO TABS: 37.5000 mg | ORAL_TABLET | Freq: Every day | ORAL | Status: DC

## 2015-01-16 NOTE — Progress Notes (Signed)
  Subjective:    CC: Follow-up  HPI: Obesity: 5 additional pound weight loss after the third month on phentermine, doing well. His wife thinks he is too skinny.  Lumbar radiculopathy: Amenable to do the MRI now.  Past medical history, Surgical history, Family history not pertinant except as noted below, Social history, Allergies, and medications have been entered into the medical record, reviewed, and no changes needed.   Review of Systems: No fevers, chills, night sweats, weight loss, chest pain, or shortness of breath.   Objective:    General: Well Developed, well nourished, and in no acute distress.  Neuro: Alert and oriented x3, extra-ocular muscles intact, sensation grossly intact.  HEENT: Normocephalic, atraumatic, pupils equal round reactive to light, neck supple, no masses, no lymphadenopathy, thyroid nonpalpable.  Skin: Warm and dry, no rashes. Cardiac: Regular rate and rhythm, no murmurs rubs or gallops, no lower extremity edema.  Respiratory: Clear to auscultation bilaterally. Not using accessory muscles, speaking in full sentences.  Impression and Recommendations:

## 2015-01-16 NOTE — Assessment & Plan Note (Signed)
Good additional weight loss after the third month.  Refilling phentermine.

## 2015-02-13 ENCOUNTER — Encounter: Payer: Self-pay | Admitting: Sports Medicine

## 2015-02-13 ENCOUNTER — Ambulatory Visit (INDEPENDENT_AMBULATORY_CARE_PROVIDER_SITE_OTHER): Payer: 59 | Admitting: Sports Medicine

## 2015-02-13 VITALS — BP 123/77 | HR 81 | Wt 214.0 lb

## 2015-02-13 DIAGNOSIS — E669 Obesity, unspecified: Secondary | ICD-10-CM

## 2015-02-13 MED ORDER — PHENTERMINE HCL 37.5 MG PO TABS: 37.5000 mg | ORAL_TABLET | Freq: Every day | ORAL | Status: AC

## 2015-02-13 NOTE — Assessment & Plan Note (Signed)
Good continued weight loss, 8 pound since last month, we are entering the fifth month of phentermine, total weight loss is 57 pounds.

## 2015-02-13 NOTE — Progress Notes (Signed)
  Subjective:    CC: Obesity  HPI: Continue weight loss, 57 pounds total and 8 pounds since last visit, we are entering the fifth month, no side effects.  Past medical history, Surgical history, Family history not pertinant except as noted below, Social history, Allergies, and medications have been entered into the medical record, reviewed, and no changes needed.   Review of Systems: No fevers, chills, night sweats, weight loss, chest pain, or shortness of breath.   Objective:    General: Well Developed, well nourished, and in no acute distress.  Neuro: Alert and oriented x3, extra-ocular muscles intact, sensation grossly intact.  HEENT: Normocephalic, atraumatic, pupils equal round reactive to light, neck supple, no masses, no lymphadenopathy, thyroid nonpalpable.  Skin: Warm and dry, no rashes. Cardiac: Regular rate and rhythm, no murmurs rubs or gallops, no lower extremity edema.  Respiratory: Clear to auscultation bilaterally. Not using accessory muscles, speaking in full sentences.  Impression and Recommendations:

## 2015-03-13 ENCOUNTER — Ambulatory Visit: Payer: 59 | Admitting: Sports Medicine

## 2015-05-12 ENCOUNTER — Telehealth: Payer: Self-pay

## 2015-05-12 NOTE — Telephone Encounter (Signed)
Patient would like to have MRI of lumbar spine done.  He has not had this done yet and wants to try with the new year.  Tried to contact patient regarding MRI, per insurance his policy has ended as of 05/09/2015.  Will need new insurance info.

## 2015-05-25 ENCOUNTER — Telehealth: Payer: Self-pay | Admitting: *Deleted

## 2015-05-25 NOTE — Telephone Encounter (Signed)
lmsg for patient to call back with updated insurance info before we can schedule his MRI

## 2015-12-14 IMAGING — CR DG KNEE COMPLETE 4+V*R*
4 series · 4 of 4 positions shown · non-contrast
Comparison: None.

CLINICAL DATA: Left knee pain

EXAM:
RIGHT KNEE - COMPLETE 4+ VIEW

[knee ap]
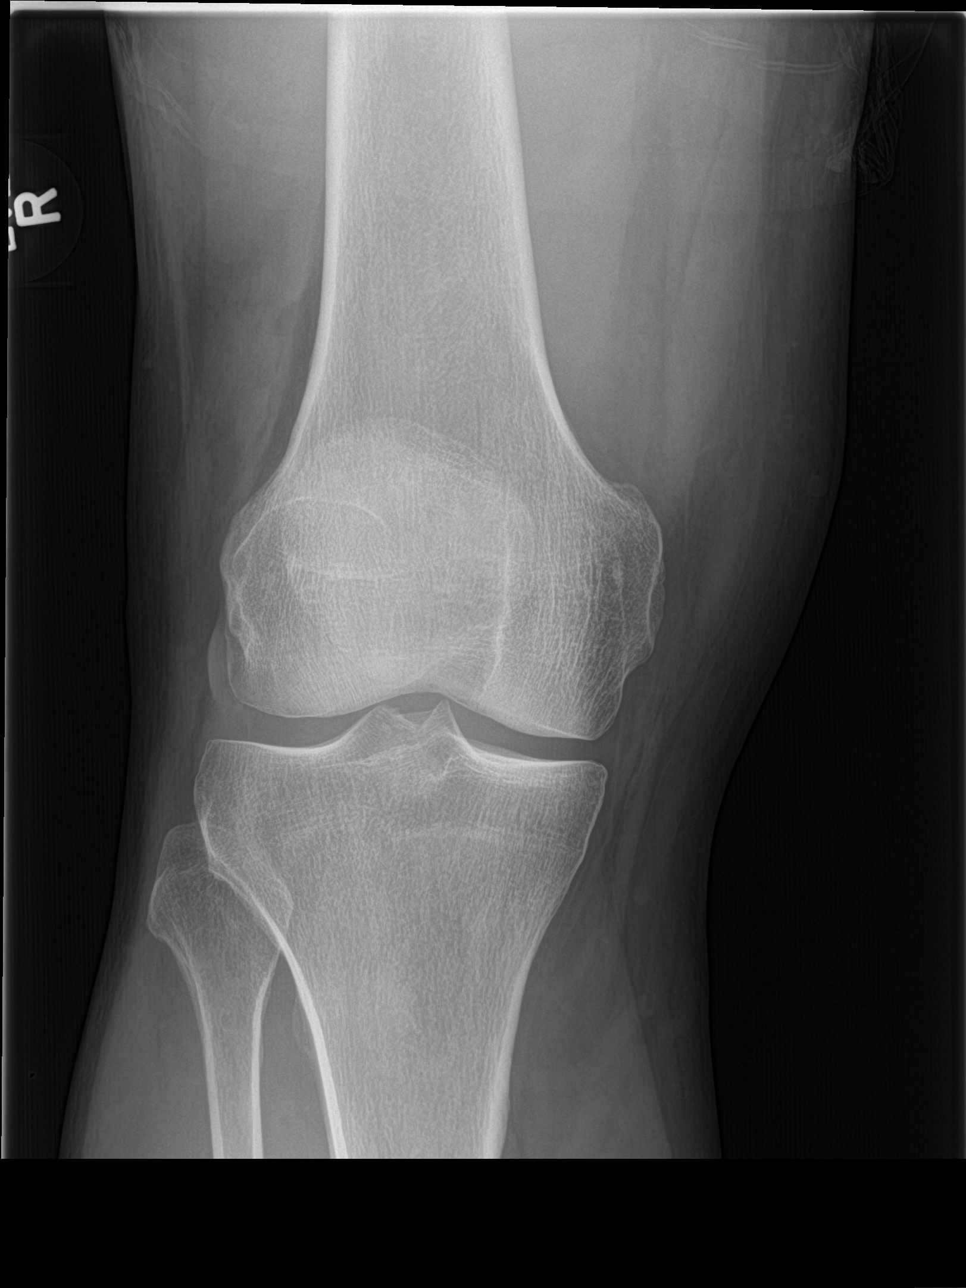

[knee obl]
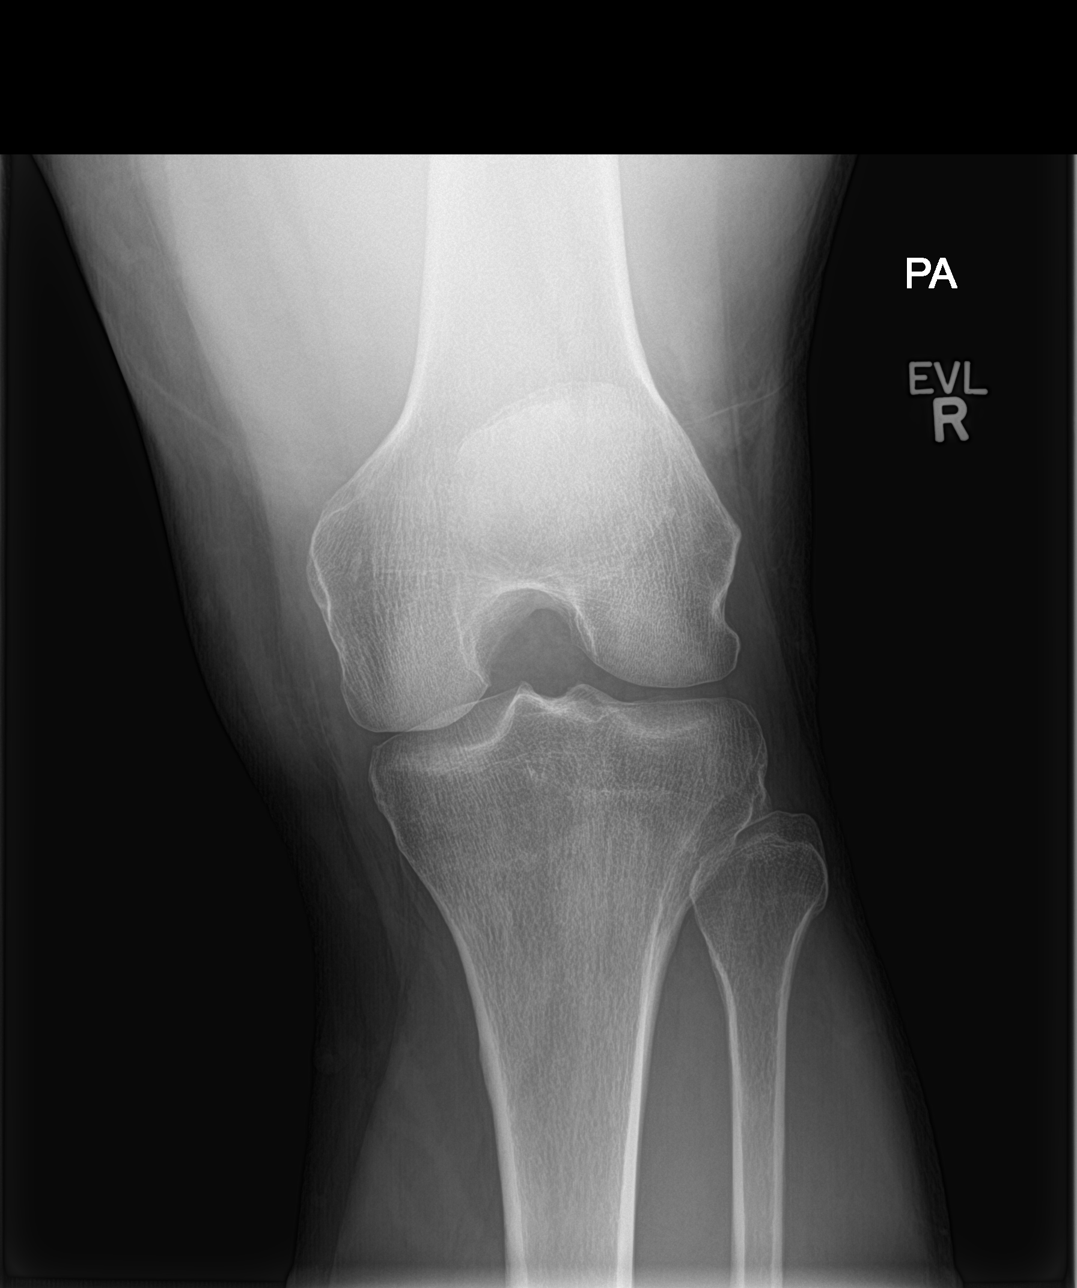

[knee lat]
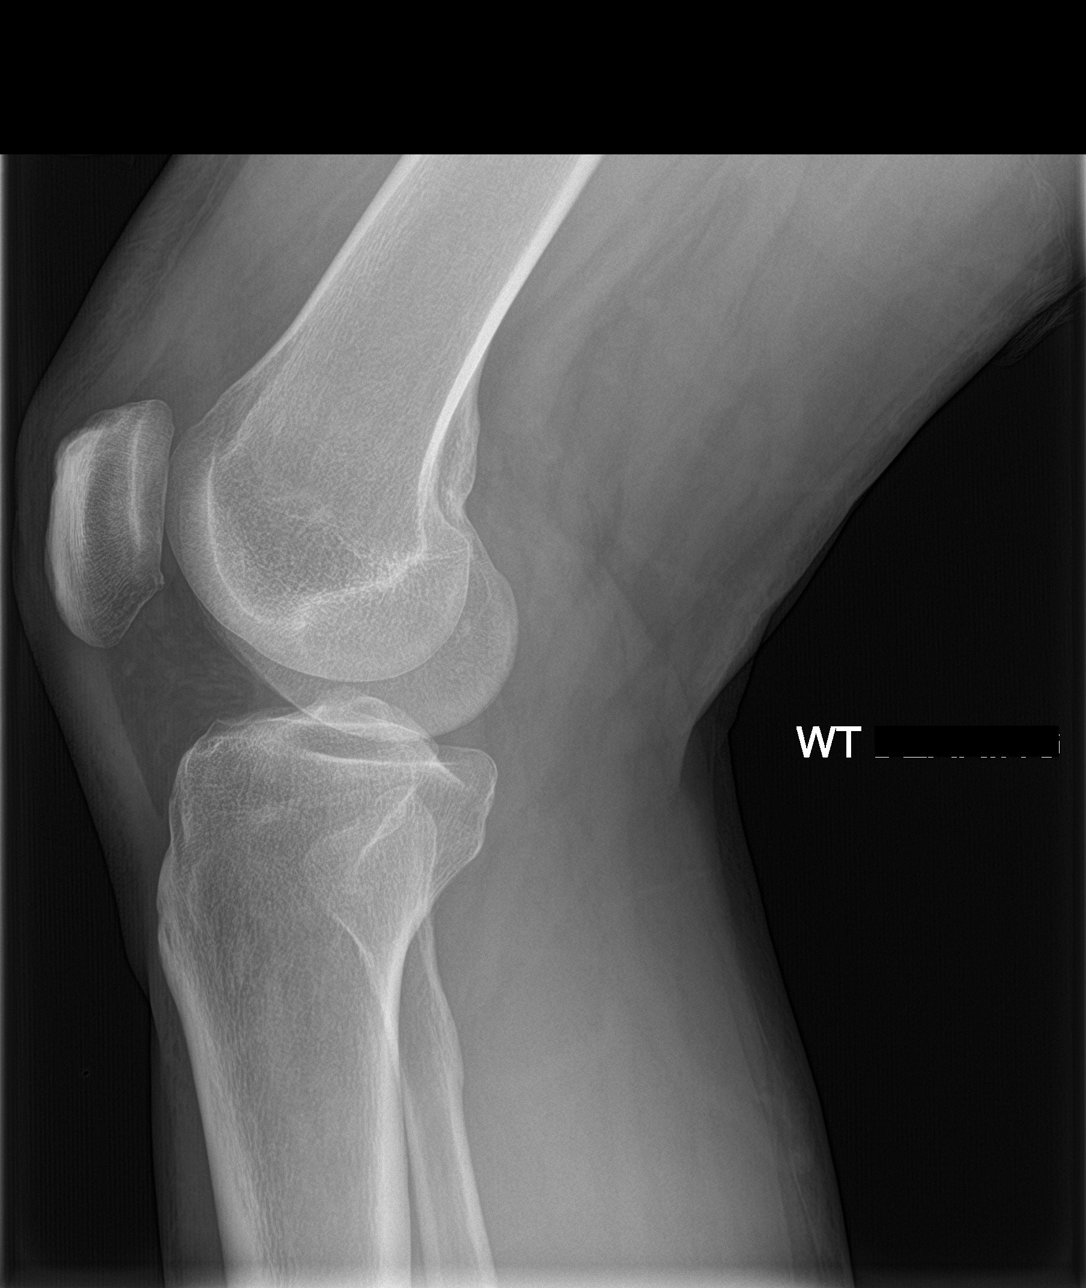

[knee sunrise]
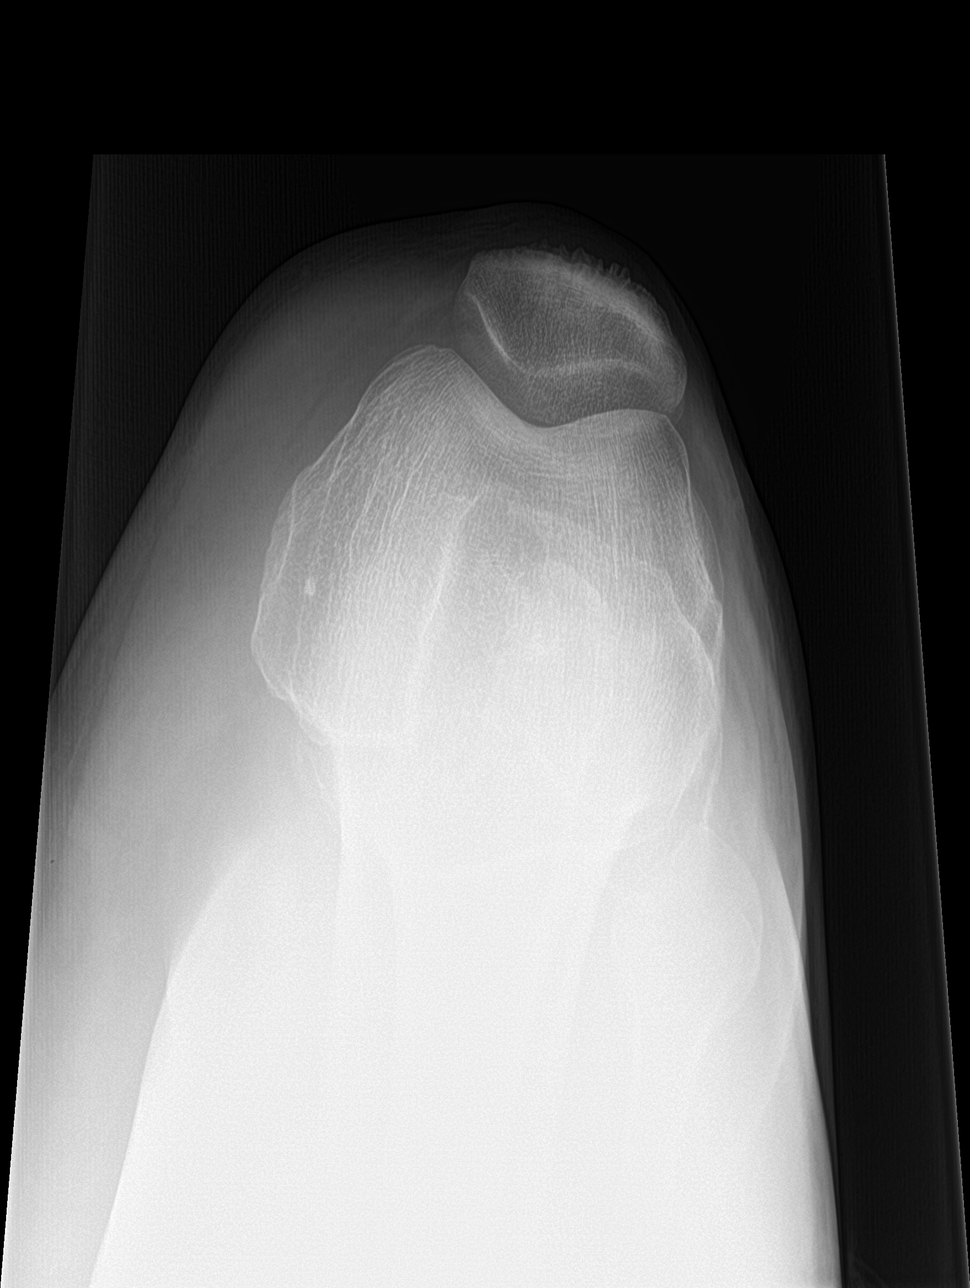

[4 of 4 positions shown; findings below may reference images not displayed]

FINDINGS: There is no evidence of fracture, dislocation, or joint effusion.
There is no evidence of arthropathy or other focal bone abnormality.
Joint spaces are maintained and normal. Soft tissues are
unremarkable.
IMPRESSION: Negative.
